# Patient Record
Sex: Female | Born: 1980 | Race: Black or African American | Hispanic: No | Marital: Married | Smoking: Never smoker
Health system: Southern US, Community
[De-identification: ages and names within clinical notes are randomized; demographics above are authoritative.]

## PROBLEM LIST (undated history)

## (undated) DIAGNOSIS — I10 Essential (primary) hypertension: Secondary | ICD-10-CM

## (undated) DIAGNOSIS — K760 Fatty (change of) liver, not elsewhere classified: Secondary | ICD-10-CM

## (undated) DIAGNOSIS — Z789 Other specified health status: Secondary | ICD-10-CM

## (undated) DIAGNOSIS — Z87442 Personal history of urinary calculi: Secondary | ICD-10-CM

## (undated) HISTORY — PX: NO PAST SURGERIES: SHX2092

---

## 2009-04-02 ENCOUNTER — Other Ambulatory Visit: Admission: RE | Admit: 2009-04-02 | Discharge: 2009-04-02 | Payer: Self-pay | Admitting: Family Medicine

## 2009-04-27 ENCOUNTER — Emergency Department (HOSPITAL_COMMUNITY): Admission: EM | Admit: 2009-04-27 | Discharge: 2009-04-27 | Payer: Self-pay | Admitting: Emergency Medicine

## 2011-01-17 LAB — POCT URINALYSIS DIP (DEVICE)
Bilirubin Urine: NEGATIVE
Ketones, ur: NEGATIVE mg/dL

## 2011-01-17 LAB — POCT PREGNANCY, URINE
Preg Test, Ur: NEGATIVE
Preg Test, Ur: NEGATIVE

## 2011-03-25 LAB — RUBELLA ANTIBODY, IGM: Rubella: IMMUNE

## 2011-03-25 LAB — ABO/RH: RH Type: POSITIVE

## 2011-03-25 LAB — ANTIBODY SCREEN: Antibody Screen: NEGATIVE

## 2011-03-25 LAB — HEPATITIS B SURFACE ANTIGEN: Hepatitis B Surface Ag: NEGATIVE

## 2011-03-25 LAB — GC/CHLAMYDIA PROBE AMP, GENITAL: Chlamydia: NEGATIVE

## 2011-03-25 LAB — RPR: RPR: NONREACTIVE

## 2011-04-14 ENCOUNTER — Inpatient Hospital Stay (HOSPITAL_COMMUNITY)
Admission: AD | Admit: 2011-04-14 | Discharge: 2011-04-14 | Disposition: A | Discharge status: Home / Self Care | Payer: 59 | Source: Ambulatory Visit | Attending: Obstetrics and Gynecology | Admitting: Obstetrics and Gynecology

## 2011-04-14 DIAGNOSIS — O209 Hemorrhage in early pregnancy, unspecified: Secondary | ICD-10-CM

## 2011-04-14 LAB — URINALYSIS, ROUTINE W REFLEX MICROSCOPIC
Protein, ur: NEGATIVE mg/dL
Urobilinogen, UA: 0.2 mg/dL (ref 0.0–1.0)

## 2011-04-14 LAB — URINE MICROSCOPIC-ADD ON

## 2011-10-27 ENCOUNTER — Encounter (HOSPITAL_COMMUNITY): Payer: Self-pay | Admitting: *Deleted

## 2011-10-27 ENCOUNTER — Encounter (HOSPITAL_COMMUNITY): Payer: Self-pay | Admitting: Anesthesiology

## 2011-10-27 ENCOUNTER — Inpatient Hospital Stay (HOSPITAL_COMMUNITY): Payer: 59 | Admitting: Anesthesiology

## 2011-10-27 ENCOUNTER — Inpatient Hospital Stay (HOSPITAL_COMMUNITY)
Admission: AD | Admit: 2011-10-27 | Discharge: 2011-10-29 | DRG: 775 | Disposition: A | Discharge status: Home / Self Care | Payer: 59 | Source: Ambulatory Visit | Attending: Obstetrics and Gynecology | Admitting: Obstetrics and Gynecology

## 2011-10-27 ENCOUNTER — Other Ambulatory Visit: Payer: Self-pay | Admitting: Obstetrics and Gynecology

## 2011-10-27 HISTORY — DX: Other specified health status: Z78.9

## 2011-10-27 LAB — COMPREHENSIVE METABOLIC PANEL
Alkaline Phosphatase: 143 U/L — ABNORMAL HIGH (ref 39–117)
BUN: 9 mg/dL (ref 6–23)
GFR calc Af Amer: >90 mL/min (ref >90)
GFR calc non Af Amer: >90 mL/min (ref >90)
Glucose, Bld: 79 mg/dL (ref 70–99)
Potassium: 4.1 mEq/L (ref 3.5–5.1)
Total Protein: 5.5 g/dL — ABNORMAL LOW (ref 6.0–8.3)

## 2011-10-27 LAB — URINALYSIS, ROUTINE W REFLEX MICROSCOPIC
Bilirubin Urine: NEGATIVE
Ketones, ur: NEGATIVE mg/dL
Protein, ur: 100 mg/dL — AB
Urobilinogen, UA: 0.2 mg/dL (ref 0.0–1.0)

## 2011-10-27 LAB — URINE MICROSCOPIC-ADD ON

## 2011-10-27 LAB — CBC
HCT: 34.9 % — ABNORMAL LOW (ref 36.0–46.0)
Hemoglobin: 11.4 g/dL — ABNORMAL LOW (ref 12.0–15.0)
MCH: 27.6 pg (ref 26.0–34.0)
MCHC: 32.7 g/dL (ref 30.0–36.0)

## 2011-10-27 LAB — URIC ACID: Uric Acid, Serum: 5.1 mg/dL (ref 2.4–7.0)

## 2011-10-27 MED ORDER — OXYTOCIN 20 UNITS IN LACTATED RINGERS INFUSION - SIMPLE
125.0000 mL/h | Freq: Once | INTRAVENOUS | Status: AC
  Administered 2011-10-27: 125 mL/h via INTRAVENOUS

## 2011-10-27 MED ORDER — LIDOCAINE HCL (PF) 1 % IJ SOLN
30.0000 mL | INTRAMUSCULAR | Status: DC | PRN
  Administered 2011-10-27: 30 mL via SUBCUTANEOUS
  Filled 2011-10-27: qty 30

## 2011-10-27 MED ORDER — ONDANSETRON HCL 4 MG/2ML IJ SOLN: 4.0000 mg | INTRAMUSCULAR | Status: DC | PRN

## 2011-10-27 MED ORDER — ONDANSETRON HCL 4 MG/2ML IJ SOLN: 4.0000 mg | Freq: Four times a day (QID) | INTRAMUSCULAR | Status: DC | PRN

## 2011-10-27 MED ORDER — EPHEDRINE 5 MG/ML INJ
10.0000 mg | INTRAVENOUS | Status: DC | PRN
  Filled 2011-10-27: qty 4

## 2011-10-27 MED ORDER — BENZOCAINE-MENTHOL 20-0.5 % EX AERO
INHALATION_SPRAY | CUTANEOUS | Status: AC
  Filled 2011-10-27: qty 56

## 2011-10-27 MED ORDER — EPHEDRINE 5 MG/ML INJ: 10.0000 mg | INTRAVENOUS | Status: DC | PRN

## 2011-10-27 MED ORDER — OXYTOCIN BOLUS FROM INFUSION
500.0000 mL | Freq: Once | INTRAVENOUS | Status: DC
  Filled 2011-10-27: qty 1000
  Filled 2011-10-27: qty 500

## 2011-10-27 MED ORDER — SENNOSIDES-DOCUSATE SODIUM 8.6-50 MG PO TABS
2.0000 | ORAL_TABLET | Freq: Every day | ORAL | Status: DC
  Administered 2011-10-28: 2 via ORAL

## 2011-10-27 MED ORDER — ONDANSETRON HCL 4 MG PO TABS: 4.0000 mg | ORAL_TABLET | ORAL | Status: DC | PRN

## 2011-10-27 MED ORDER — BENZOCAINE-MENTHOL 20-0.5 % EX AERO
1.0000 "application " | INHALATION_SPRAY | CUTANEOUS | Status: DC | PRN
  Administered 2011-10-28: 1 via TOPICAL

## 2011-10-27 MED ORDER — CITRIC ACID-SODIUM CITRATE 334-500 MG/5ML PO SOLN: 30.0000 mL | ORAL | Status: DC | PRN

## 2011-10-27 MED ORDER — LANOLIN HYDROUS EX OINT: TOPICAL_OINTMENT | CUTANEOUS | Status: DC | PRN

## 2011-10-27 MED ORDER — SODIUM BICARBONATE 8.4 % IV SOLN
INTRAVENOUS | Status: DC | PRN
  Administered 2011-10-27: 4 mL via EPIDURAL

## 2011-10-27 MED ORDER — PHENYLEPHRINE 40 MCG/ML (10ML) SYRINGE FOR IV PUSH (FOR BLOOD PRESSURE SUPPORT): 80.0000 ug | PREFILLED_SYRINGE | INTRAVENOUS | Status: DC | PRN

## 2011-10-27 MED ORDER — DIPHENHYDRAMINE HCL 50 MG/ML IJ SOLN: 12.5000 mg | INTRAMUSCULAR | Status: DC | PRN

## 2011-10-27 MED ORDER — LACTATED RINGERS IV SOLN
INTRAVENOUS | Status: DC
  Administered 2011-10-27 (×4): via INTRAVENOUS

## 2011-10-27 MED ORDER — OXYTOCIN 20 UNITS IN LACTATED RINGERS INFUSION - SIMPLE
1.0000 m[IU]/min | INTRAVENOUS | Status: DC
  Administered 2011-10-27: 8 m[IU]/min via INTRAVENOUS
  Administered 2011-10-27: 6 m[IU]/min via INTRAVENOUS
  Administered 2011-10-27: 2 m[IU]/min via INTRAVENOUS
  Administered 2011-10-27: 4 m[IU]/min via INTRAVENOUS

## 2011-10-27 MED ORDER — ZOLPIDEM TARTRATE 5 MG PO TABS: 5.0000 mg | ORAL_TABLET | Freq: Every evening | ORAL | Status: DC | PRN

## 2011-10-27 MED ORDER — FLEET ENEMA 7-19 GM/118ML RE ENEM: 1.0000 | ENEMA | RECTAL | Status: DC | PRN

## 2011-10-27 MED ORDER — OXYCODONE-ACETAMINOPHEN 5-325 MG PO TABS: 2.0000 | ORAL_TABLET | ORAL | Status: DC | PRN

## 2011-10-27 MED ORDER — DIBUCAINE 1 % RE OINT: 1.0000 "application " | TOPICAL_OINTMENT | RECTAL | Status: DC | PRN

## 2011-10-27 MED ORDER — OXYCODONE-ACETAMINOPHEN 5-325 MG PO TABS: 1.0000 | ORAL_TABLET | ORAL | Status: DC | PRN

## 2011-10-27 MED ORDER — TERBUTALINE SULFATE 1 MG/ML IJ SOLN: 0.2500 mg | Freq: Once | INTRAMUSCULAR | Status: DC | PRN

## 2011-10-27 MED ORDER — TETANUS-DIPHTH-ACELL PERTUSSIS 5-2.5-18.5 LF-MCG/0.5 IM SUSP: 0.5000 mL | Freq: Once | INTRAMUSCULAR | Status: DC

## 2011-10-27 MED ORDER — PHENYLEPHRINE 40 MCG/ML (10ML) SYRINGE FOR IV PUSH (FOR BLOOD PRESSURE SUPPORT)
80.0000 ug | PREFILLED_SYRINGE | INTRAVENOUS | Status: DC | PRN
  Filled 2011-10-27: qty 5

## 2011-10-27 MED ORDER — WITCH HAZEL-GLYCERIN EX PADS: 1.0000 "application " | MEDICATED_PAD | CUTANEOUS | Status: DC | PRN

## 2011-10-27 MED ORDER — FLEET ENEMA 7-19 GM/118ML RE ENEM: 1.0000 | ENEMA | Freq: Every day | RECTAL | Status: DC | PRN

## 2011-10-27 MED ORDER — LACTATED RINGERS IV SOLN: 500.0000 mL | INTRAVENOUS | Status: DC | PRN

## 2011-10-27 MED ORDER — BUTORPHANOL TARTRATE 2 MG/ML IJ SOLN: 1.0000 mg | INTRAMUSCULAR | Status: DC | PRN

## 2011-10-27 MED ORDER — DIPHENHYDRAMINE HCL 25 MG PO CAPS: 25.0000 mg | ORAL_CAPSULE | Freq: Four times a day (QID) | ORAL | Status: DC | PRN

## 2011-10-27 MED ORDER — IBUPROFEN 600 MG PO TABS: 600.0000 mg | ORAL_TABLET | Freq: Four times a day (QID) | ORAL | Status: DC | PRN

## 2011-10-27 MED ORDER — FENTANYL 2.5 MCG/ML BUPIVACAINE 1/10 % EPIDURAL INFUSION (WH - ANES)
INTRAMUSCULAR | Status: DC | PRN
Start: 2011-10-27 — End: 2011-10-28
  Administered 2011-10-27: 14 mL/h via EPIDURAL

## 2011-10-27 MED ORDER — IBUPROFEN 600 MG PO TABS
600.0000 mg | ORAL_TABLET | Freq: Four times a day (QID) | ORAL | Status: DC
  Administered 2011-10-28 – 2011-10-29 (×6): 600 mg via ORAL
  Filled 2011-10-27 (×5): qty 1

## 2011-10-27 MED ORDER — BISACODYL 10 MG RE SUPP: 10.0000 mg | Freq: Every day | RECTAL | Status: DC | PRN

## 2011-10-27 MED ORDER — LACTATED RINGERS IV SOLN: INTRAVENOUS | Status: DC

## 2011-10-27 MED ORDER — FENTANYL 2.5 MCG/ML BUPIVACAINE 1/10 % EPIDURAL INFUSION (WH - ANES)
14.0000 mL/h | INTRAMUSCULAR | Status: DC
  Administered 2011-10-27 (×3): 14 mL/h via EPIDURAL
  Filled 2011-10-27 (×4): qty 60

## 2011-10-27 MED ORDER — PRENATAL MULTIVITAMIN CH
1.0000 | ORAL_TABLET | Freq: Every day | ORAL | Status: DC
  Administered 2011-10-28: 11:00:00 via ORAL
  Administered 2011-10-29: 1 via ORAL
  Filled 2011-10-27 (×2): qty 1

## 2011-10-27 MED ORDER — LACTATED RINGERS IV SOLN
500.0000 mL | Freq: Once | INTRAVENOUS | Status: AC
  Administered 2011-10-27: 500 mL via INTRAVENOUS

## 2011-10-27 MED ORDER — ACETAMINOPHEN 325 MG PO TABS: 650.0000 mg | ORAL_TABLET | ORAL | Status: DC | PRN

## 2011-10-27 MED ORDER — SIMETHICONE 80 MG PO CHEW: 80.0000 mg | CHEWABLE_TABLET | ORAL | Status: DC | PRN

## 2011-10-27 NOTE — Anesthesia Preprocedure Evaluation (Signed)

## 2011-10-27 NOTE — Progress Notes (Signed)
Delivery Note Rapid progress , was 8cm, +1, ROP with variable decels  Rolled to LLD position , rapid change to C&P at +3 SVD, ROA, VMI  Apgars  7/9, art pH 7.25 Wt 5# 2 oz Placenta intact, 3 vessels to path for SGA Cx/vag intact Second degree ML lac  Repaired Pt infant stable in LDR

## 2011-10-27 NOTE — H&P (Signed)
Kristin Buck is a 31 y.o. female presenting for C/O UCs since about 8 pm last night.  Now getting stronger.  No ROM/HA/vision change/epigastric pain. Maternal Medical History:  Reason for admission: Reason for admission: contractions.  Contractions: Onset was 6-12 hours ago.   Frequency: regular.   Perceived severity is moderate.    Fetal activity: Perceived fetal activity is normal.      OB History    Grav Para Term Preterm Abortions TAB SAB Ect Mult Living   1              Past Medical History  Diagnosis Date  . No pertinent past medical history    Past Surgical History  Procedure Date  . No past surgeries    Family History: family history is negative for Anesthesia problems, and Malignant hyperthermia, and Hypotension, and Pseudochol deficiency, . Social History:  reports that she has never smoked. She has never used smokeless tobacco. She reports that she does not drink alcohol or use illicit drugs.  Review of Systems  Eyes: Negative for blurred vision.  Gastrointestinal: Negative for abdominal pain.  Neurological: Negative for headaches.    Dilation: 2.5 Effacement (%): 80 Station: -2 Exam by:: Kristin Buck Blood pressure 138/85, pulse 78, temperature 98.6 F (37 C), temperature source Oral, resp. rate 18, height 5' 8.5" (1.74 m), weight 95.709 kg (211 lb), SpO2 98.00%. Maternal Exam:  Uterine Assessment: Contraction strength is firm.  Contraction frequency is regular.   Abdomen: Patient reports no abdominal tenderness.   Fetal Exam Fetal Monitor Review: Pattern: accelerations present.       Physical Exam  Cardiovascular: Normal rate and regular rhythm.   Respiratory: Effort normal and breath sounds normal.  Neurological: She has normal reflexes.   AROM  Clear Results for orders placed during the hospital encounter of 10/27/11 (from the past 24 hour(s))  STREP B DNA PROBE     Status: Normal      Component Value Range   Group B Strep Ag Negative      CBC     Status: Abnormal   Collection Time   10/27/11  5:00 AM      Component Value Range   WBC 10.2  4.0 - 10.5 (K/uL)   RBC 4.13  3.87 - 5.11 (MIL/uL)   Hemoglobin 11.4 (*) 12.0 - 15.0 (g/dL)   HCT 16.1 (*) 09.6 - 46.0 (%)   MCV 84.5  78.0 - 100.0 (fL)   MCH 27.6  26.0 - 34.0 (pg)   MCHC 32.7  30.0 - 36.0 (g/dL)   RDW 04.5 (*) 40.9 - 15.5 (%)   Platelets 185  150 - 400 (K/uL)  COMPREHENSIVE METABOLIC PANEL     Status: Abnormal   Collection Time   10/27/11  5:00 AM      Component Value Range   Sodium 136  135 - 145 (mEq/L)   Potassium 4.1  3.5 - 5.1 (mEq/L)   Chloride 104  96 - 112 (mEq/L)   CO2 24  19 - 32 (mEq/L)   Glucose, Bld 79  70 - 99 (mg/dL)   BUN 9  6 - 23 (mg/dL)   Creatinine, Ser 8.11  0.50 - 1.10 (mg/dL)   Calcium 9.3  8.4 - 91.4 (mg/dL)   Total Protein 5.5 (*) 6.0 - 8.3 (g/dL)   Albumin 2.7 (*) 3.5 - 5.2 (g/dL)   AST 18  0 - 37 (U/L)   ALT 17  0 - 35 (U/L)   Alkaline Phosphatase  143 (*) 39 - 117 (U/L)   Total Bilirubin 0.1 (*) 0.3 - 1.2 (mg/dL)   GFR calc non Af Amer >90  >90 (mL/min)   GFR calc Af Amer >90  >90 (mL/min)  URIC ACID     Status: Normal   Collection Time   10/27/11  5:00 AM      Component Value Range   Uric Acid, Serum 5.1  2.4 - 7.0 (mg/dL)  LACTATE DEHYDROGENASE     Status: Normal   Collection Time   10/27/11  5:00 AM      Component Value Range   LD 194  94 - 250 (U/L)  URINALYSIS, ROUTINE W REFLEX MICROSCOPIC     Status: Abnormal   Collection Time   10/27/11  5:40 AM      Component Value Range   Color, Urine YELLOW  YELLOW    APPearance CLEAR  CLEAR    Specific Gravity, Urine 1.025  1.005 - 1.030    pH 6.0  5.0 - 8.0    Glucose, UA NEGATIVE  NEGATIVE (mg/dL)   Hgb urine dipstick LARGE (*) NEGATIVE    Bilirubin Urine NEGATIVE  NEGATIVE    Ketones, ur NEGATIVE  NEGATIVE (mg/dL)   Protein, ur 161 (*) NEGATIVE (mg/dL)   Urobilinogen, UA 0.2  0.0 - 1.0 (mg/dL)   Nitrite NEGATIVE  NEGATIVE    Leukocytes, UA NEGATIVE  NEGATIVE   URINE  MICROSCOPIC-ADD ON     Status: Normal   Collection Time   10/27/11  5:40 AM      Component Value Range   Squamous Epithelial / LPF RARE  RARE    WBC, UA 3-6  <3 (WBC/hpf)   RBC / HPF 0-2  <3 (RBC/hpf)   Bacteria, UA RARE  RARE    Prenatal labs: ABO, Rh: B/Positive/-- (06/14 0000) Antibody: Negative (06/14 0000) Rubella: Immune (06/14 0000) RPR: Nonreactive (06/14 0000)  HBsAg: Negative (06/14 0000)  HIV: Non-reactive (06/14 0000)  GBS: Negative (01/16 0646)   Assessment/Plan: 31 yo MBF G1P0 at 33 1/7 weeks with labor. D/W pt above-will watch BPs. D/W pain relief options.   Kristin Buck,Kristin Buck 10/27/2011, 8:45 AM

## 2011-10-27 NOTE — Progress Notes (Signed)
Epidural in FHT reactive Cx 4 cm per nurse check about 1 hour ago

## 2011-10-27 NOTE — ED Provider Notes (Signed)
History     Chief Complaint  Patient presents with  . Contractions   HPI 31 y.o. G1P0 at [redacted]w[redacted]d here for labor check. Medical screening exam requested by RN d/t elevated BPs, labs ordered for PIH eval by Dr. Arelia Sneddon. Pt states she has had some elevated BPs during prenatal visits. Denies headache, vision changes, abdominal pain. + fetal movement.    Past Medical History  Diagnosis Date  . No pertinent past medical history     Past Surgical History  Procedure Date  . No past surgeries     Family History  Problem Relation Age of Onset  . Anesthesia problems Neg Hx   . Malignant hyperthermia Neg Hx   . Hypotension Neg Hx   . Pseudochol deficiency Neg Hx     History  Substance Use Topics  . Smoking status: Never Smoker   . Smokeless tobacco: Never Used  . Alcohol Use: No    Allergies: No Known Allergies  Prescriptions prior to admission  Medication Sig Dispense Refill  . Prenatal Vit-Fe Fumarate-FA (PRENATAL MULTIVITAMIN) TABS Take 1 tablet by mouth daily.        Review of Systems  Constitutional: Negative.   Respiratory: Negative.   Cardiovascular: Negative.   Gastrointestinal: Negative for nausea, vomiting, abdominal pain, diarrhea and constipation.  Genitourinary: Negative for dysuria, urgency, frequency, hematuria and flank pain.       Negative for vaginal bleeding, Positive for contractions  Musculoskeletal: Negative.   Neurological: Negative.   Psychiatric/Behavioral: Negative.    Physical Exam   Blood pressure 149/91, pulse 78, temperature 99.1 F (37.3 C), temperature source Oral, resp. rate 20, height 5' 8.5" (1.74 m), weight 211 lb (95.709 kg), SpO2 98.00%.  Physical Exam  Nursing note and vitals reviewed. Constitutional: She is oriented to person, place, and time. She appears well-developed and well-nourished.  Cardiovascular: Normal rate.   Respiratory: Effort normal.  GI: Soft. There is no tenderness.  Musculoskeletal: Normal range of motion.    Neurological: She is alert and oriented to person, place, and time. She has normal reflexes. No cranial nerve deficit.  Skin: Skin is warm and dry.  Psychiatric: She has a normal mood and affect.   EFM reactive  MAU Course  Procedures  Results for orders placed during the hospital encounter of 10/27/11 (from the past 24 hour(s))  CBC     Status: Abnormal   Collection Time   10/27/11  5:00 AM      Component Value Range   WBC 10.2  4.0 - 10.5 (K/uL)   RBC 4.13  3.87 - 5.11 (MIL/uL)   Hemoglobin 11.4 (*) 12.0 - 15.0 (g/dL)   HCT 69.6 (*) 29.5 - 46.0 (%)   MCV 84.5  78.0 - 100.0 (fL)   MCH 27.6  26.0 - 34.0 (pg)   MCHC 32.7  30.0 - 36.0 (g/dL)   RDW 28.4 (*) 13.2 - 15.5 (%)   Platelets 185  150 - 400 (K/uL)  COMPREHENSIVE METABOLIC PANEL     Status: Abnormal   Collection Time   10/27/11  5:00 AM      Component Value Range   Sodium 136  135 - 145 (mEq/L)   Potassium 4.1  3.5 - 5.1 (mEq/L)   Chloride 104  96 - 112 (mEq/L)   CO2 24  19 - 32 (mEq/L)   Glucose, Bld 79  70 - 99 (mg/dL)   BUN 9  6 - 23 (mg/dL)   Creatinine, Ser 4.40  0.50 - 1.10 (  mg/dL)   Calcium 9.3  8.4 - 16.1 (mg/dL)   Total Protein 5.5 (*) 6.0 - 8.3 (g/dL)   Albumin 2.7 (*) 3.5 - 5.2 (g/dL)   AST 18  0 - 37 (U/L)   ALT 17  0 - 35 (U/L)   Alkaline Phosphatase 143 (*) 39 - 117 (U/L)   Total Bilirubin 0.1 (*) 0.3 - 1.2 (mg/dL)   GFR calc non Af Amer >90  >90 (mL/min)   GFR calc Af Amer >90  >90 (mL/min)  URIC ACID     Status: Normal   Collection Time   10/27/11  5:00 AM      Component Value Range   Uric Acid, Serum 5.1  2.4 - 7.0 (mg/dL)  LACTATE DEHYDROGENASE     Status: Normal   Collection Time   10/27/11  5:00 AM      Component Value Range   LD 194  94 - 250 (U/L)  URINALYSIS, ROUTINE W REFLEX MICROSCOPIC     Status: Abnormal   Collection Time   10/27/11  5:40 AM      Component Value Range   Color, Urine YELLOW  YELLOW    APPearance CLEAR  CLEAR    Specific Gravity, Urine 1.025  1.005 - 1.030    pH  6.0  5.0 - 8.0    Glucose, UA NEGATIVE  NEGATIVE (mg/dL)   Hgb urine dipstick LARGE (*) NEGATIVE    Bilirubin Urine NEGATIVE  NEGATIVE    Ketones, ur NEGATIVE  NEGATIVE (mg/dL)   Protein, ur 096 (*) NEGATIVE (mg/dL)   Urobilinogen, UA 0.2  0.0 - 1.0 (mg/dL)   Nitrite NEGATIVE  NEGATIVE    Leukocytes, UA NEGATIVE  NEGATIVE   URINE MICROSCOPIC-ADD ON     Status: Normal   Collection Time   10/27/11  5:40 AM      Component Value Range   Squamous Epithelial / LPF RARE  RARE    WBC, UA 3-6  <3 (WBC/hpf)   RBC / HPF 0-2  <3 (RBC/hpf)   Bacteria, UA RARE  RARE      Assessment and Plan  31 y.o. G1P0 at [redacted]w[redacted]d [facility] - labs and status to be reported to Dr. Arelia Sneddon by RN  Georges Mouse 10/27/2011, 6:14 AM

## 2011-10-27 NOTE — Anesthesia Procedure Notes (Signed)

## 2011-10-28 ENCOUNTER — Encounter (HOSPITAL_COMMUNITY): Payer: Self-pay | Admitting: Pediatrics

## 2011-10-28 LAB — CBC
MCH: 28.2 pg (ref 26.0–34.0)
MCHC: 33.6 g/dL (ref 30.0–36.0)
Platelets: 181 10*3/uL (ref 150–400)

## 2011-10-28 NOTE — Anesthesia Postprocedure Evaluation (Signed)
  Anesthesia Post-op Note  Patient: Kristin Buck  Procedure(s) Performed: * No procedures listed *  Patient Location: PACU and Mother/Baby  Anesthesia Type: Epidural  Level of Consciousness: awake, alert  and oriented  Airway and Oxygen Therapy: Patient Spontanous Breathing  Post-op Assessment: Post-op Vital signs reviewed, Patient's Cardiovascular Status Stable, No headache, No backache, No residual numbness and No residual motor weakness  Post-op Vital Signs: Reviewed and stable  Complications: No apparent anesthesia complications

## 2011-10-28 NOTE — Progress Notes (Signed)
Post Partum Day 1 Subjective: no complaints, up ad lib, voiding and tolerating PO  Objective: Blood pressure 136/85, pulse 97, temperature 98 F (36.7 C), temperature source Oral, resp. rate 18, height 5' 8.5" (1.74 m), weight 95.709 kg (211 lb), SpO2 98.00%, unknown if currently breastfeeding.  Physical Exam:  General: alert and cooperative Lochia: appropriate Uterine Fundus: firm Perineum intact DVT Evaluation: No evidence of DVT seen on physical exam.   Basename 10/28/11 0607 10/27/11 0500  HGB 9.6* 11.4*  HCT 28.6* 34.9*    Assessment/Plan: Plan for discharge tomorrow   LOS: 1 day   Kristin Buck,Kristin Buck 10/28/2011, 8:09 AM

## 2011-10-29 LAB — COMPREHENSIVE METABOLIC PANEL
AST: 27 U/L (ref 0–37)
Albumin: 2.6 g/dL — ABNORMAL LOW (ref 3.5–5.2)
Chloride: 106 mEq/L (ref 96–112)
Creatinine, Ser: 0.68 mg/dL (ref 0.50–1.10)
Potassium: 3.8 mEq/L (ref 3.5–5.1)
Total Bilirubin: 0.1 mg/dL — ABNORMAL LOW (ref 0.3–1.2)
Total Protein: 5.5 g/dL — ABNORMAL LOW (ref 6.0–8.3)

## 2011-10-29 LAB — CBC
MCHC: 33.2 g/dL (ref 30.0–36.0)
MCV: 84.6 fL (ref 78.0–100.0)
Platelets: 182 10*3/uL (ref 150–400)
RDW: 16 % — ABNORMAL HIGH (ref 11.5–15.5)
WBC: 13 10*3/uL — ABNORMAL HIGH (ref 4.0–10.5)

## 2011-10-29 MED ORDER — IBUPROFEN 600 MG PO TABS: 600.0000 mg | ORAL_TABLET | Freq: Four times a day (QID) | ORAL | Status: AC

## 2011-10-29 MED ORDER — OXYCODONE-ACETAMINOPHEN 5-325 MG PO TABS: 1.0000 | ORAL_TABLET | ORAL | Status: DC | PRN

## 2011-10-29 NOTE — Discharge Summary (Signed)
Obstetric Discharge Summary Reason for Admission: onset of labor Prenatal Procedures: ultrasound Intrapartum Procedures: spontaneous vaginal delivery Postpartum Procedures: none Complications-Operative and Postpartum: 2 degree perineal laceration Hemoglobin  Date Value Range Status  10/29/2011 9.3* 12.0-15.0 (g/dL) Final     HCT  Date Value Range Status  10/29/2011 28.0* 36.0-46.0 (%) Final    Discharge Diagnoses: Term Pregnancy-delivered  Discharge Information: Date: 10/29/2011 Activity: pelvic rest Diet: routine Medications: PNV, Ibuprofen and Percocet Condition: stable Instructions: refer to practice specific booklet Discharge to: home   Newborn Data: Live born female  Birth Weight: 5 lb 3.6 oz (2370 g) APGAR: 7, 9  Home with mother.  Rendy Lazard G 10/29/2011, 9:17 AM

## 2011-10-29 NOTE — Progress Notes (Signed)
Post Partum Day 2 Subjective: up ad lib, voiding, tolerating PO, + flatus and denies HA, RUQ pain. Baby has not voided after circ  Objective: Blood pressure 144/90, pulse 103, temperature 97.9 F (36.6 C), temperature source Oral, resp. rate 20, height 5' 8.5" (1.74 m), weight 95.709 kg (211 lb), SpO2 98.00%, unknown if currently breastfeeding.  Physical Exam:  General: alert and cooperative Lochia: appropriate Uterine Fundus: firm Perineum intact DVT Evaluation: No evidence of DVT seen on physical exam. DTR's 2-3 + , small pedal edema  Basename 10/28/11 0607 10/27/11 0500  HGB 9.6* 11.4*  HCT 28.6* 34.9*    Assessment/Plan: PIH labs ordered Labs normal  Plan to discharge home with PIH precautions and BP recheck scheduled for 3 days   LOS: 2 days   CURTIS,CAROL G 10/29/2011, 7:54 AM

## 2011-10-29 NOTE — Progress Notes (Signed)
Pt requested supplemental feeding for infant because she felt baby was not getting enough from breastfeeding.  Pt given Enfamil and I monitored father feed infant 10 ml of formula.

## 2011-11-01 ENCOUNTER — Ambulatory Visit (HOSPITAL_COMMUNITY)
Admit: 2011-11-01 | Discharge: 2011-11-01 | Disposition: A | Discharge status: Home / Self Care | Payer: 59 | Attending: Obstetrics and Gynecology | Admitting: Obstetrics and Gynecology

## 2011-11-01 ENCOUNTER — Inpatient Hospital Stay (HOSPITAL_COMMUNITY)
Admission: AD | Admit: 2011-11-01 | Discharge: 2011-11-03 | DRG: 776 | Disposition: A | Discharge status: Home / Self Care | Payer: 59 | Source: Ambulatory Visit | Attending: Obstetrics and Gynecology | Admitting: Obstetrics and Gynecology

## 2011-11-01 ENCOUNTER — Other Ambulatory Visit: Payer: Self-pay | Admitting: Obstetrics and Gynecology

## 2011-11-01 DIAGNOSIS — IMO0002 Reserved for concepts with insufficient information to code with codable children: Principal | ICD-10-CM | POA: Diagnosis present

## 2011-11-01 DIAGNOSIS — O1490 Unspecified pre-eclampsia, unspecified trimester: Secondary | ICD-10-CM

## 2011-11-01 MED ORDER — HYDROCHLOROTHIAZIDE 25 MG PO TABS
50.0000 mg | ORAL_TABLET | Freq: Every day | ORAL | Status: AC
  Administered 2011-11-01 – 2011-11-02 (×2): 50 mg via ORAL
  Filled 2011-11-01 (×2): qty 2

## 2011-11-01 MED ORDER — SIMETHICONE 80 MG PO CHEW: 80.0000 mg | CHEWABLE_TABLET | ORAL | Status: DC | PRN

## 2011-11-01 MED ORDER — DEXTROSE IN LACTATED RINGERS 5 % IV SOLN
INTRAVENOUS | Status: DC
  Administered 2011-11-01 – 2011-11-02 (×3): via INTRAVENOUS

## 2011-11-01 MED ORDER — SENNOSIDES-DOCUSATE SODIUM 8.6-50 MG PO TABS: 2.0000 | ORAL_TABLET | Freq: Every day | ORAL | Status: DC

## 2011-11-01 MED ORDER — FLEET ENEMA 7-19 GM/118ML RE ENEM: 1.0000 | ENEMA | Freq: Every day | RECTAL | Status: DC | PRN

## 2011-11-01 MED ORDER — LANOLIN HYDROUS EX OINT: TOPICAL_OINTMENT | CUTANEOUS | Status: DC | PRN

## 2011-11-01 MED ORDER — OXYCODONE-ACETAMINOPHEN 5-325 MG PO TABS: 1.0000 | ORAL_TABLET | Freq: Four times a day (QID) | ORAL | Status: DC | PRN

## 2011-11-01 MED ORDER — BISACODYL 10 MG RE SUPP: 10.0000 mg | Freq: Every day | RECTAL | Status: DC | PRN

## 2011-11-01 MED ORDER — WITCH HAZEL-GLYCERIN EX PADS: 1.0000 "application " | MEDICATED_PAD | CUTANEOUS | Status: DC | PRN

## 2011-11-01 MED ORDER — ZOLPIDEM TARTRATE 5 MG PO TABS: 5.0000 mg | ORAL_TABLET | Freq: Every evening | ORAL | Status: DC | PRN

## 2011-11-01 MED ORDER — ONDANSETRON HCL 4 MG PO TABS: 4.0000 mg | ORAL_TABLET | ORAL | Status: DC | PRN

## 2011-11-01 MED ORDER — LIDOCAINE HCL (PF) 1 % IJ SOLN
30.0000 mL | INTRAMUSCULAR | Status: DC | PRN
  Filled 2011-11-01: qty 30

## 2011-11-01 MED ORDER — DIPHENHYDRAMINE HCL 25 MG PO CAPS: 25.0000 mg | ORAL_CAPSULE | Freq: Four times a day (QID) | ORAL | Status: DC | PRN

## 2011-11-01 MED ORDER — MAGNESIUM SULFATE BOLUS VIA INFUSION
4.0000 g | Freq: Once | INTRAVENOUS | Status: AC
  Administered 2011-11-01: 4 g via INTRAVENOUS
  Filled 2011-11-01: qty 500

## 2011-11-01 MED ORDER — BENZOCAINE-MENTHOL 20-0.5 % EX AERO
1.0000 "application " | INHALATION_SPRAY | CUTANEOUS | Status: DC | PRN
  Administered 2011-11-02: 1 via TOPICAL

## 2011-11-01 MED ORDER — ONDANSETRON HCL 4 MG/2ML IJ SOLN: 4.0000 mg | INTRAMUSCULAR | Status: DC | PRN

## 2011-11-01 MED ORDER — IBUPROFEN 800 MG PO TABS
800.0000 mg | ORAL_TABLET | Freq: Three times a day (TID) | ORAL | Status: DC | PRN
  Administered 2011-11-02 – 2011-11-03 (×4): 800 mg via ORAL
  Filled 2011-11-01 (×4): qty 1

## 2011-11-01 MED ORDER — PRENATAL MULTIVITAMIN CH
1.0000 | ORAL_TABLET | Freq: Every day | ORAL | Status: DC
Start: 2011-11-02 — End: 2011-11-03
  Administered 2011-11-02 – 2011-11-03 (×2): 1 via ORAL
  Filled 2011-11-01 (×2): qty 1

## 2011-11-01 MED ORDER — MAGNESIUM SULFATE 40 G IN LACTATED RINGERS - SIMPLE
2.0000 g/h | INTRAVENOUS | Status: AC
  Administered 2011-11-01: 2 g/h via INTRAVENOUS
  Filled 2011-11-01: qty 500

## 2011-11-01 MED ORDER — DIBUCAINE 1 % RE OINT: 1.0000 "application " | TOPICAL_OINTMENT | RECTAL | Status: DC | PRN

## 2011-11-01 NOTE — Progress Notes (Signed)
New admit - pt rec'd as direct adm to room 372 under services Dr. Marcelle Overlie.  Hx SVD 1/16, term female infant.  Pt breast/bottle feeding.  Uncomplicated delivery.  Seen in office today with  inc BP, BLE edema, elevated LFT's and proteinuria.   Here for Magnesium Sulfate therapy.

## 2011-11-01 NOTE — H&P (Signed)
NAME:  Kristin Buck, Kristin Buck NO.:  [id number]  MEDICAL RECORD NO.:  [id number]  LOCATION:  L                             FACILITY:  [facility]  PHYSICIAN:  Duke Salvia. Marcelle Overlie, M.D.DATE OF BIRTH:  1980/11/22  DATE OF ADMISSION:  11/01/2011 DATE OF DISCHARGE:                             HISTORY & PHYSICAL   CHIEF COMPLAINT:  Postpartum was significant edema.  HISTORY OF PRESENT ILLNESS:  A 31 year old who is 5 days postpartum vaginal delivery.  I was told to come back in the office today for followup.  In our office, BP 170/98.  Urine 1+ for protein.  Repeat lab work today showed slight elevation of her SGOT and PT which were formally normal on October 29, 2011, when she was discharged from the hospital.  She is admitted now for diuresis in 24 hours of magnesium sulfate treatment.  PAST MEDICAL HISTORY:  Please see the [facility] form for details.  PHYSICAL EXAMINATION:  VITAL SIGNS:  BP 170/98. HEENT:  Unremarkable. NECK:  Supple without masses. LUNGS:  Clear. CARDIOVASCULAR:  Regular rate and rhythm without murmurs, rubs, gallops. BREASTS:  Not examined. ABDOMEN:  Soft, flat, fundus was firm and nontender. PELVIC:  Deferred. EXTREMITIES:  Lower extremities 2+.  Significant lower extremity edema. Reflexes 2+, no clonus.  IMPRESSION:  Postpartum preeclampsia with slight abnormality of her liver function tests and significant edema.  PLAN:  We will admit for magnesium sulfate x24 hours, plus diuresis.     Jadee Golebiewski M. Marcelle Overlie, M.D.     [facility]/MEDQ  D:  11/01/2011  T:  11/01/2011  Job:  [id number]

## 2011-11-02 LAB — CBC
MCH: 27.7 pg (ref 26.0–34.0)
MCV: 83.8 fL (ref 78.0–100.0)
Platelets: 279 10*3/uL (ref 150–400)
RBC: 3.76 MIL/uL — ABNORMAL LOW (ref 3.87–5.11)
RDW: 15.6 % — ABNORMAL HIGH (ref 11.5–15.5)

## 2011-11-02 LAB — URIC ACID: Uric Acid, Serum: 5.5 mg/dL (ref 2.4–7.0)

## 2011-11-02 LAB — MAGNESIUM: Magnesium: 3.9 mg/dL — ABNORMAL HIGH (ref 1.5–2.5)

## 2011-11-02 LAB — COMPREHENSIVE METABOLIC PANEL
AST: 36 U/L (ref 0–37)
CO2: 26 mEq/L (ref 19–32)
Calcium: 8.6 mg/dL (ref 8.4–10.5)
Creatinine, Ser: 0.61 mg/dL (ref 0.50–1.10)
GFR calc Af Amer: >90 mL/min (ref >90)
GFR calc non Af Amer: >90 mL/min (ref >90)

## 2011-11-02 MED ORDER — BENZOCAINE-MENTHOL 20-0.5 % EX AERO
INHALATION_SPRAY | CUTANEOUS | Status: AC
  Filled 2011-11-02: qty 56

## 2011-11-02 NOTE — Progress Notes (Signed)
Adult Lactation Consultation Outpatient Visit Note  Patient Name: Kristin Buck Date of Birth: 04/10/1981 Gestational Age at Delivery: [redacted]w[redacted]d Type of Delivery: Vag  Breastfeeding History: Frequency of Breastfeeding: every 2.5-3 hours with the SNS with formula added. Mother giving the baby 45-52ml and states baby tolerates well. She gives expressed breast milk by dropper prior to feeding. Length of Feeding: 15-20 minutes Voids: greater than 8/day Stools: 3-4/day; brown  Supplementing / Method: Pumping:  Type of Pump :PIS Advance   Frequency: Every 3 hours  Volume:  5 ml  Comments: Pt here today due to severe and unrelieved engorgement x 36 hours. Baby has been latching to breast with SNS but the last 2 nights, the  baby has been finger fed and bottle fed due to mother's fatigue and breast pain. She continues to pump during the night. Mother is very motivated to exclusively breast feed.   Consultation Evaluation:  Initial Feeding Assessment: Pre-feed Weight: Post-feed Weight: Amount Transferred:Infant was not placed at the breast due to severe engorgement, poor let down and minimum milk transfer. Infant is 5 pounds and was recently fed prior to appointment. Mother did give Chrissie Noa her expressed milk of 5 ml. Comments:  Additional Feeding Assessment: Pre-feed Weight: Post-feed Weight: Amount Transferred: Comments:  Additional Feeding Assessment: Pre-feed Weight: Post-feed Weight: Amount Transferred: Comments:  Total Breast milk Transferred this Visit:  Total Supplement Given:   Additional Interventions: Management of severe engorgement was the focus of this appointment. Breast were hard to touch in all quadrants. Slight breast softening after pumping for 20 minutes along with breast massage. Mother pumped with her PIS and flange size was changed to 27 mm to increase comfort. Attempted 30 mm but "pop offs" were noted. Reviewed protocol for managing engorgement using ice  and heat, reverse pressure softening, limited cabbage leaf usage and hand expression. Replaced mother's breast pump with a  hospital grade DEBP but it did not make a difference in milk  output. There was no evidence of mastitis other than soreness at this time. Mother had 4+ pitting edema at discharge and still has extreme edema in feet and lower extremities. She has been seen by her OB and labs drawn today to  evaluate for PIH. Her OB has suggested a diuretic to reduce fluid. Instructed to stay well hydrated, breast feed often and pump for additional stimulation to maintain milk supply if diuretic is started.Mother will continue to use the SNS at the breast with formula/EBM added until able to have milk let downs.  Follow-Up  Nov 04, 2011 at 4:00pm  Lactation appointment to evaluate engorgement, milk supply and infant's ability to transfer milk.    Christella Hartigan M 11/02/2011, 11:10 AM

## 2011-11-02 NOTE — Progress Notes (Signed)
Repositioned, BP retaken - has been lying with BP cuff on dependant arm - new value 148/95 107)

## 2011-11-02 NOTE — Progress Notes (Signed)
No HA/vision change/epigastric pain  PE  BP  130-147/87-97        UO good  I/O 1514/3000  Lungs CTA Cor NSR Ext 1+ edema DTR 1+  Magnesium sulfate running Labs pending  A/P  PP preeclampsia         Check labs today         Continue magnesium for 24 hrs total         D/W pt

## 2011-11-02 NOTE — Consult Note (Signed)
I briefly met with mom in AICU. Mom deilvered 5 days ago, baby SGA and not able to latch. Mom admitted for high BP. Her breasts are extremely engorged. She has her PIS DEP with her. I helped her pump - massage  - minimal amounts of milk expressed from each side - total 4-5 ml;s. I gave mom ice packs to use. Mom knows to use heat with pumping. I also showed her how to use her machine ot pump one breast at a time, so she can massage while pumping.Mom is getting her ibuprofen every 6 hours.

## 2011-11-02 NOTE — Progress Notes (Signed)
UR chart review completed.  

## 2011-11-03 LAB — COMPREHENSIVE METABOLIC PANEL
ALT: 39 U/L — ABNORMAL HIGH (ref 0–35)
AST: 28 U/L (ref 0–37)
Albumin: 2.7 g/dL — ABNORMAL LOW (ref 3.5–5.2)
Alkaline Phosphatase: 112 U/L (ref 39–117)
Chloride: 101 mEq/L (ref 96–112)
Potassium: 3.7 mEq/L (ref 3.5–5.1)
Sodium: 134 mEq/L — ABNORMAL LOW (ref 135–145)
Total Bilirubin: 0.2 mg/dL — ABNORMAL LOW (ref 0.3–1.2)

## 2011-11-03 LAB — CBC
HCT: 30.9 % — ABNORMAL LOW (ref 36.0–46.0)
Platelets: 281 10*3/uL (ref 150–400)
RDW: 15.4 % (ref 11.5–15.5)
WBC: 7.7 10*3/uL (ref 4.0–10.5)

## 2011-11-03 NOTE — Progress Notes (Signed)
Pt. Being d/c'd home, accompanied by her husband and mother and NT

## 2011-11-03 NOTE — Progress Notes (Signed)
Pt. With D/C home orders acknowledged, VS checked one last time when d/cing IVL and BP elevated. Dr. Rana Snare notified and v.o. received to have me call pharmacy with perscription for Labetalol 100mg  p.o. BID.  Prescription called in to [facility] on [address]. in [place]. Pharmacy picked by pt.

## 2011-11-03 NOTE — Progress Notes (Signed)
Patient ID: Kristin Buck, female   DOB: Feb 09, 1981, 31 y.o.   MRN: [id number] Pt without complaints VSSAF BP 100-130/70-90 Labs improved UPO -4100/24 hours DTRs 3/4 1+ edema  PP PIH Stable of MgSo4 DC homoe with FU in 2 days for BP and recheck labs DL

## 2011-11-04 ENCOUNTER — Encounter (HOSPITAL_COMMUNITY): Payer: 59

## 2011-11-08 ENCOUNTER — Encounter (HOSPITAL_COMMUNITY): Payer: 59

## 2011-11-09 NOTE — Discharge Summary (Signed)
Patient Name: Kristin Buck ZOX:[id number] 31 yo female G1P1001 that was admitted 5 days postpartum uncomplicated vaginal delivery for pp edema and elevated bp. BP in the office was 170/98 and patient was observed to have 1+ proteinuria. PIH labs had also revealed a slight elevation in AST and ALT. Patient was  admitted for MagSo4 and diuresis. Magso4 was administered for 24 hrs. BP remained stable 130/80's. Total urine output  was 4100 cc /24 hrs. Patient was started on Labetalol 100 mg BID. Precautions were reviewed with patient and patient was discharged home with instructions to RTO in 2 days for BP check and repeat PIH labs.  CC, [facility]NP

## 2011-11-10 ENCOUNTER — Ambulatory Visit (HOSPITAL_COMMUNITY)
Admission: RE | Admit: 2011-11-10 | Discharge: 2011-11-10 | Disposition: A | Discharge status: Home / Self Care | Payer: 59 | Source: Ambulatory Visit | Attending: Obstetrics and Gynecology | Admitting: Obstetrics and Gynecology

## 2011-11-10 NOTE — Progress Notes (Signed)
Adult Lactation Consultation Outpatient Visit Note  Patient Name: Kristin Buck Date of Birth: [birth year] Gestational Age at Delivery: [redacted]w[redacted]d Type of Delivery:  Per mom had difficulty with engorgement for 3-4 days and then was readmitted due to elevated B/P and was placed on Mag in AICU and continued to have problems with engorgement ,per mom cold cabbage leaves helped the most . While mom was in the hospital the 2nd time infant received bottles because mom did not feel up to breast feeding due to the engorgement and now has been having difficulty re latching .  Breastfeeding History: Frequency of Breastfeeding: see above note  Length of Feeding:  Voids: >6 Stools:> 2-3 yellow seedy stools    Supplementing / Method: Pumping:  Type of Pump:DEBP Medela    Frequency:approximately every 3-4 hours ( per probably 8x's in 24 hours)   Volume: up to 60 ml 2 breast combined    Comments:per mom denies any further engorgement except today presented at consult full to firm due to a GYN appointment .     Consultation Evaluation:  Initial Feeding Assessment: Pre-feed Weight:6-3.6 (2822g)  Post-feed Weight: 6.4.4oz (2848g)  Amount Transferred: 26ml  Comments:prior to latch due to mom's fullness mom had to pre pump . Infant fed 15-20 mins and was able to stay latched in a consistent swallowing pattern with intermittent gulps . After feeding infant sleepy . Had mom pump other breast yielding 30 ml which mom fed to infant because dad had time restrains to get to work .   Additional Feeding Assessment: Pre-feed Weight: Post-feed Weight: Amount Transferred: Comments:  Additional Feeding Assessment: Pre-feed Weight: Post-feed Weight: Amount Transferred: Comments:  Total Breast milk Transferred this Visit: 26ml at the breast  Total Supplement Given: 30ml EBM   Additional Interventions: Plan of care ! ) For mom naps ,drinking to thirst , nutritious meals and snacks  2) Transitioning back to  the breast ,also giving one bottle per day ( dad) 3) Feed Kristin Buck every 2-3 hours day and evening 15-20 mins 1st breast and offer 2nd breast 4) Empty 1st breast well to get to the fatty milk. 5) Extra pumping ( mom on B/P meds ? Until 6 week post partum check at 6 weeks . 2-3 x's per 24 hours .    Follow-Up: Next weigh check next Tuesday 11/16/2011 at [facility] Breast feeding support group . Then by 2/8 with Dr. Ala Dach ( mom needs to call for weight check appointment       Kristin Buck 11/10/2011, 6:43 PM

## 2013-12-05 ENCOUNTER — Other Ambulatory Visit: Payer: Self-pay | Admitting: Obstetrics and Gynecology

## 2013-12-05 DIAGNOSIS — N611 Abscess of the breast and nipple: Secondary | ICD-10-CM

## 2013-12-05 DIAGNOSIS — N644 Mastodynia: Secondary | ICD-10-CM

## 2013-12-11 ENCOUNTER — Ambulatory Visit
Admission: RE | Admit: 2013-12-11 | Discharge: 2013-12-11 | Disposition: A | Discharge status: Home / Self Care | Payer: BC Managed Care – PPO | Source: Ambulatory Visit | Attending: Obstetrics and Gynecology | Admitting: Obstetrics and Gynecology

## 2013-12-11 ENCOUNTER — Other Ambulatory Visit: Payer: Self-pay | Admitting: Obstetrics and Gynecology

## 2013-12-11 DIAGNOSIS — N644 Mastodynia: Secondary | ICD-10-CM

## 2013-12-11 DIAGNOSIS — N6320 Unspecified lump in the left breast, unspecified quadrant: Secondary | ICD-10-CM

## 2013-12-11 DIAGNOSIS — N611 Abscess of the breast and nipple: Secondary | ICD-10-CM

## 2013-12-18 ENCOUNTER — Other Ambulatory Visit: Payer: 59

## 2014-08-12 ENCOUNTER — Encounter (HOSPITAL_COMMUNITY): Payer: Self-pay | Admitting: Pediatrics

## 2014-11-08 IMAGING — MG MM DIGITAL DIAGNOSTIC BILAT CAD
6 series · 6 of 6 positions shown · non-contrast
Comparison: None.

CLINICAL DATA: Patient presents for evaluation of firm palpable
abnormality within the medial aspect of the left breast. She states
that she was recently started on antibiotics for suspected
mastitis/abscess and states the symptoms have slightly improved
during this period of time.

EXAM:
DIGITAL DIAGNOSTIC  BILATERAL MAMMOGRAM WITH CAD
ULTRASOUND LEFT BREAST

[L MLO]
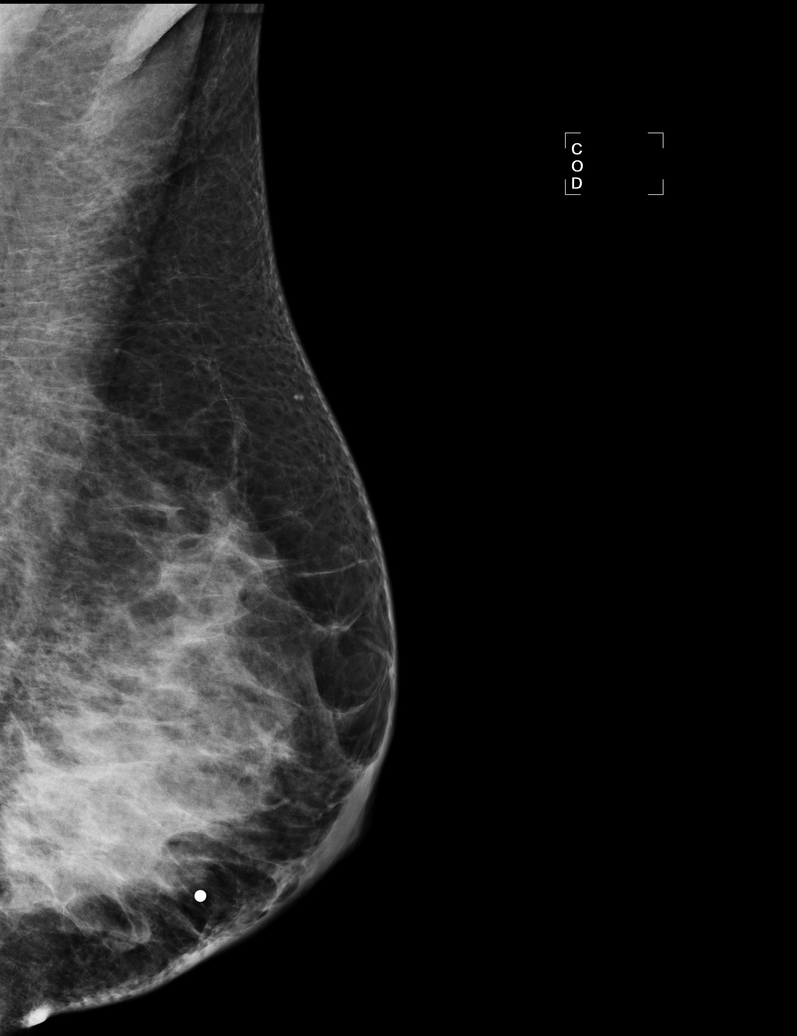

[R MLO]
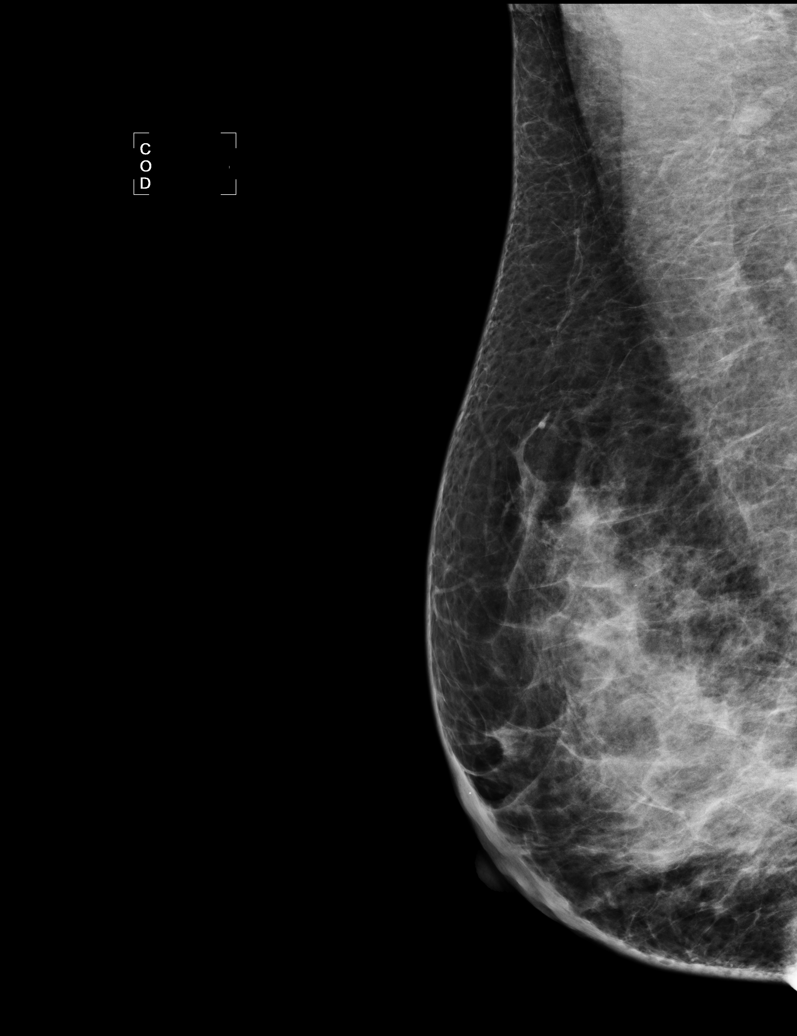

[L CC]
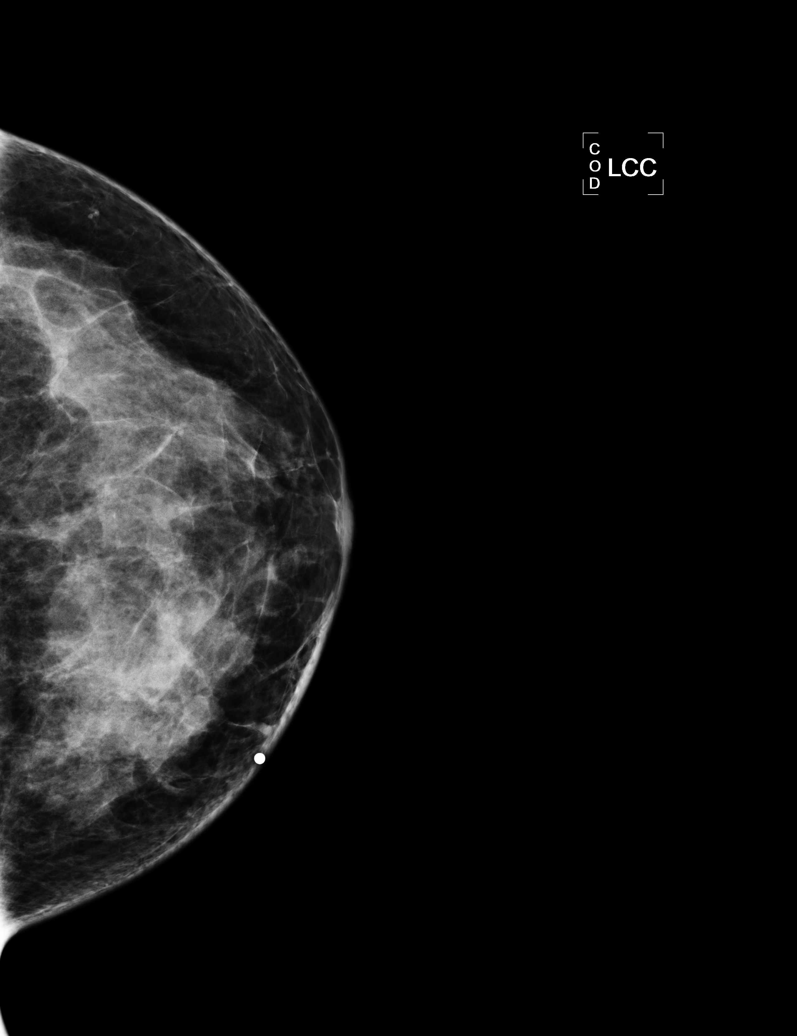

[R CC]
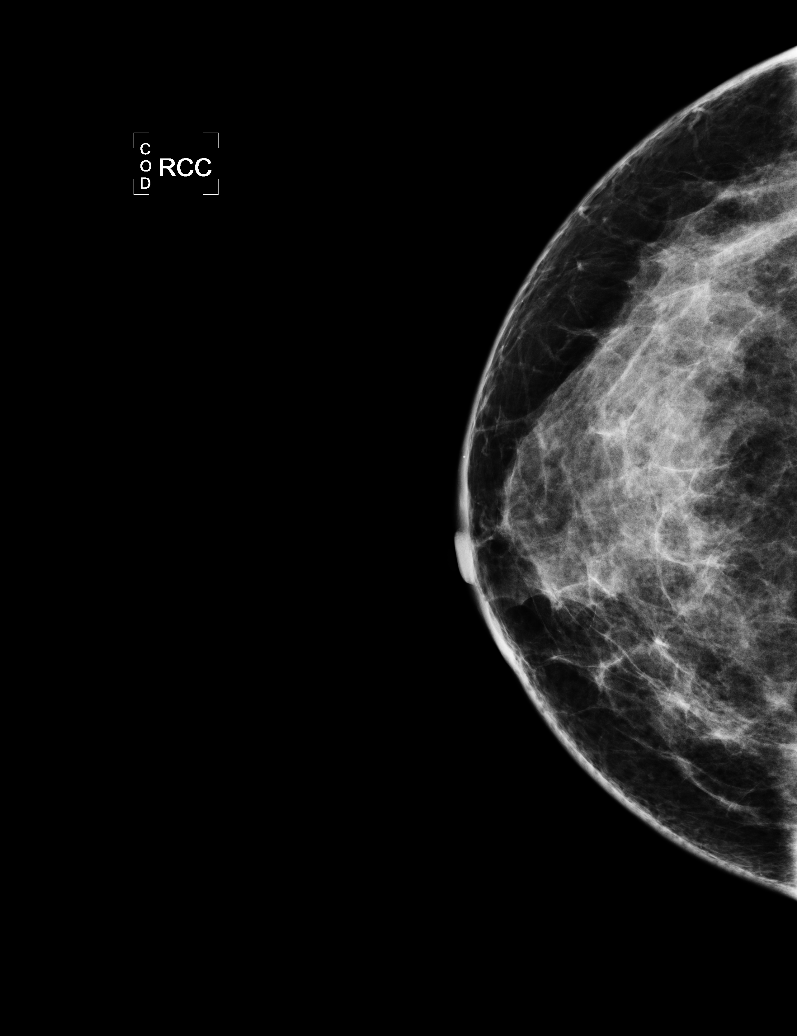

[L TAN (1 of 2)]
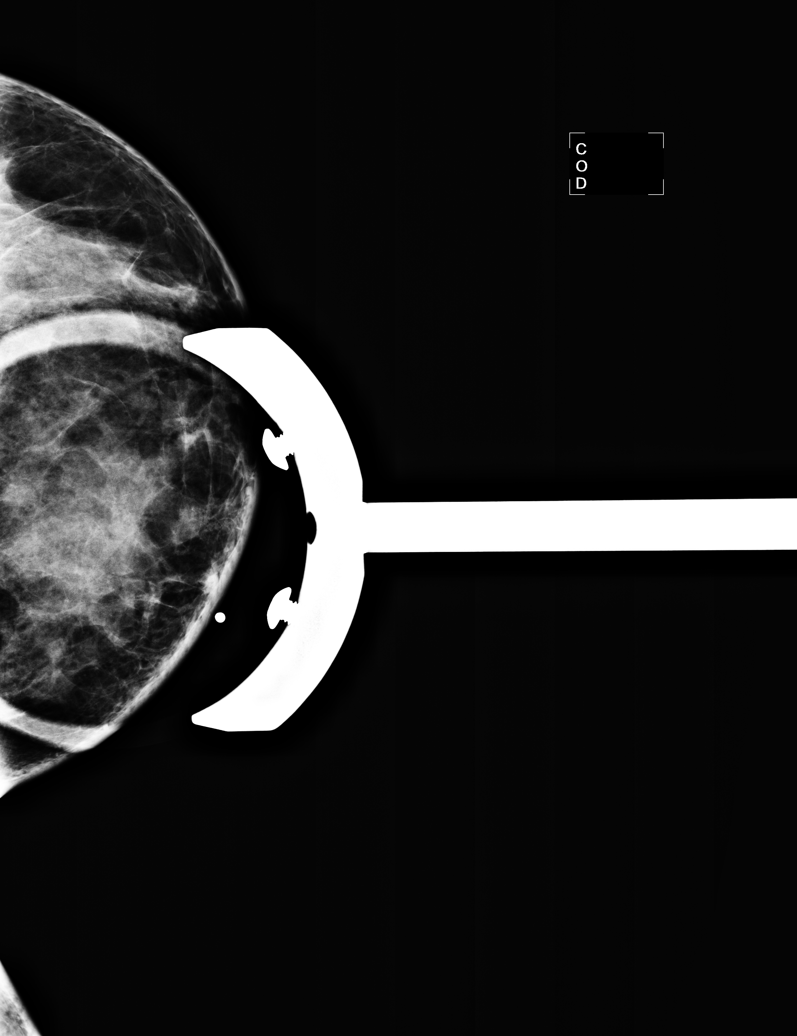

[L TAN (2 of 2)]
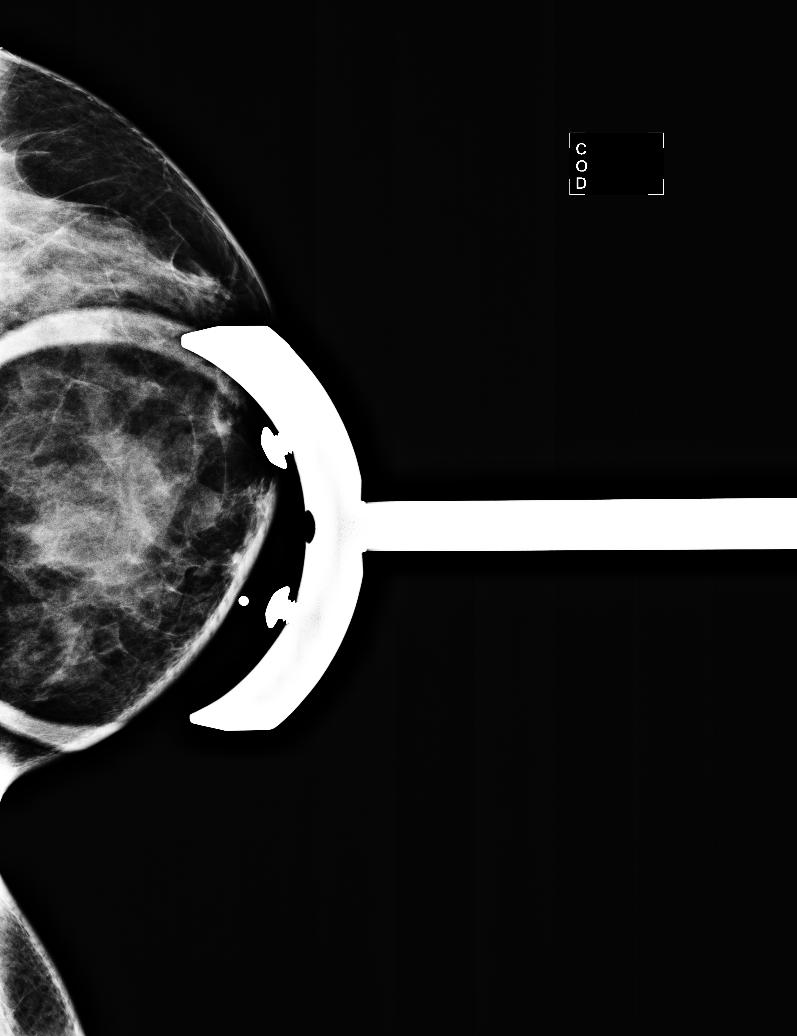

[6 of 6 positions shown; findings below may reference images not displayed]

ACR Breast Density Category c: The breast tissue is heterogeneously
dense, which may obscure small masses.
FINDINGS: Within the medial aspect of the left breast there is increased
parenchymal density at the site of palpable abnormality. No
additional concerning masses, calcifications or architectural
distortion identified.

Mammographic images were processed with CAD.

On physical exam, I palpate a firm mass within the medial aspect of
the left breast at the patients site of reported abnormality.

Ultrasound is performed, showing diffuse edema throughout the medial
aspect of the left breast with a small amount of mobile echogenic
material suggestive of abscess formation.
IMPRESSION: Increased parenchymal density with associated mixed echogenicity on
ultrasound most suggestive of edema and purulent fluid within the
left breast 7 o'clock position corresponding with palpable
abnormality. Findings are most suggestive of breast
abscess/mastitis.

RECOMMENDATION:
Continue antibiotic therapy until completion.

Diagnostic aspiration with gram stain and culture.

Followup in 2 weeks to evaluate for interval improvement/
resolution.

Findings and recommendations were discussed with Dr. Relindis on
December 11, 2013.

I have discussed the findings and recommendations with the patient.
Results were also provided in writing at the conclusion of the
visit. If applicable, a reminder letter will be sent to the patient
regarding the next appointment.

BI-RADS CATEGORY  3: Probably benign finding(s) - short interval
follow-up suggested.

## 2017-01-11 ENCOUNTER — Encounter: Payer: Self-pay | Admitting: *Deleted

## 2017-01-11 ENCOUNTER — Emergency Department (INDEPENDENT_AMBULATORY_CARE_PROVIDER_SITE_OTHER)
Admission: EM | Admit: 2017-01-11 | Discharge: 2017-01-11 | Disposition: A | Discharge status: Home / Self Care | Payer: 59 | Source: Home / Self Care | Attending: Family Medicine | Admitting: Family Medicine

## 2017-01-11 DIAGNOSIS — R109 Unspecified abdominal pain: Secondary | ICD-10-CM

## 2017-01-11 DIAGNOSIS — M546 Pain in thoracic spine: Secondary | ICD-10-CM

## 2017-01-11 DIAGNOSIS — T148XXA Other injury of unspecified body region, initial encounter: Secondary | ICD-10-CM

## 2017-01-11 HISTORY — DX: Essential (primary) hypertension: I10

## 2017-01-11 HISTORY — DX: Personal history of urinary calculi: Z87.442

## 2017-01-11 HISTORY — DX: Fatty (change of) liver, not elsewhere classified: K76.0

## 2017-01-11 LAB — POCT URINALYSIS DIP (MANUAL ENTRY)
Bilirubin, UA: NEGATIVE
Glucose, UA: NEGATIVE
Ketones, POC UA: NEGATIVE
Leukocytes, UA: NEGATIVE
Nitrite, UA: NEGATIVE
Protein Ur, POC: NEGATIVE
Spec Grav, UA: 1.02 (ref 1.030–1.035)
Urobilinogen, UA: 0.2 (ref ?–2.0)
pH, UA: 7 (ref 5.0–8.0)

## 2017-01-11 NOTE — Discharge Instructions (Signed)
Take 400-600mg  Ibuprofen (Motrin) every 6-8 hours for  pain  Alternate with Tylenol  Take  Tylenol every 4-6 hours as needed for  pain  Follow-up with your primary care provider next week for recheck of symptoms if not improving.   You may also use over the counter lidcaine cream or icy-hot to help with muscle soreness

## 2017-01-11 NOTE — ED Provider Notes (Signed)
CSN: [id number]     Arrival date & time 01/11/17  1952 History   First MD Initiated Contact with Patient 01/11/17 2003     Chief Complaint  Patient presents with  . Back Pain   (Consider location/radiation/quality/duration/timing/severity/associated sxs/prior Treatment) HPI  Kristin Buck is a 36 y.o. female presenting to UC with c/o 2-3 weeks of Right side mid back pain that is worse with certain movements and she feels associated increased burping.  She has hx of acid reflux and has recently started to take her medication again when symptoms started but has not had much relief. Pain is aching and sore, 6/10, occasionally radiates to Right flank and right groin. Denies fever, chills, n/v/d. Denies known injury. Denies blood in urine or dysuria. She thinks she may have taken tylenol or ibuprofen once of pain and does not recall if it helped or not.    Past Medical History:  Diagnosis Date  . Fatty liver   . Hypertension   . No pertinent past medical history   . Personal history of kidney stones    Past Surgical History:  Procedure Laterality Date  . CESAREAN SECTION    . NO PAST SURGERIES     Family History  Problem Relation Age of Onset  . Hypertension Mother   . Diabetes Mother   . Anesthesia problems Neg Hx   . Malignant hyperthermia Neg Hx   . Hypotension Neg Hx   . Pseudochol deficiency Neg Hx    Social History  Substance Use Topics  . Smoking status: Never Smoker  . Smokeless tobacco: Never Used  . Alcohol use No   OB History    Gravida Para Term Preterm AB Living   SAB TAB Ectopic Multiple Live Births           1     Review of Systems  Constitutional: Negative for chills and fever.  Gastrointestinal: Negative for abdominal pain, diarrhea, nausea and vomiting.  Genitourinary: Positive for flank pain. Negative for dysuria, frequency, hematuria and pelvic pain.  Musculoskeletal: Positive for back pain and myalgias. Negative for arthralgias and  gait problem.  Skin: Negative for color change and rash.  Neurological: Negative for weakness and numbness.    Allergies  Patient has no known allergies.  Home Medications   Prior to Admission medications   Medication Sig Start Date End Date Taking? Authorizing Provider  hydrochlorothiazide (MICROZIDE) 12.5 MG capsule Take 12.5 mg by mouth daily.   Yes Historical Provider, MD  omeprazole (PRILOSEC) 40 MG capsule Take 40 mg by mouth daily.   Yes Historical Provider, MD   Meds Ordered and Administered this Visit  Medications - No data to display  BP (!) 146/96 (BP Location: Left Arm)   Pulse 88   Temp 98.2 F (36.8 C) (Oral)   Resp 16   Ht 5' 8.5" (1.74 m)   Wt 191 lb (86.6 kg)   LMP 12/18/2016   SpO2 99%   BMI 28.62 kg/m  No data found.   Physical Exam  Constitutional: She is oriented to person, place, and time. She appears well-developed and well-nourished. No distress.  HENT:  Head: Normocephalic and atraumatic.  Eyes: EOM are normal.  Neck: Normal range of motion. Neck supple.  Cardiovascular: Normal rate and regular rhythm.   Pulmonary/Chest: Effort normal and breath sounds normal. No stridor. No respiratory distress. She has no wheezes. She has no rales. She exhibits no tenderness.  Abdominal: Soft. She exhibits no distension. There is no tenderness.  Musculoskeletal: Normal range of motion. She exhibits tenderness. She exhibits no edema.  No midline spinal tenderness. Tenderness to Right mid back along paraspinal muscles.  Full ROM upper and lower extremities bilaterally with 5/5 strength bilaterally.   Lymphadenopathy:    She has no cervical adenopathy.  Neurological: She is alert and oriented to person, place, and time.  Skin: Skin is warm and dry. She is not diaphoretic.  Psychiatric: She has a normal mood and affect. Her behavior is normal.  Nursing note and vitals reviewed.   Urgent Care Course     Procedures (including critical care time)  Labs  Review Labs Reviewed  POCT URINALYSIS DIP (MANUAL ENTRY) - Abnormal; Notable for the following:       Result Value   Blood, UA trace-intact (*)    All other components within normal limits    Imaging Review No results found.    MDM   1. Flank pain   2. Acute right-sided thoracic back pain   3. Muscle strain    UA unremarkable Pain likely due to back muscle strain.  Encouraged alternating acetaminophen, ibuprofen, cool and warm compresses, and may use OTC topical icy-hot  f/u with PCP in 1 week if not improving.     Junius Finner, PA-C 01/12/17 1213

## 2017-01-11 NOTE — ED Triage Notes (Addendum)
Pt c/o RT side mid back x 2 wks with increased burping.The pain radiates to her RT flank area. Denies injury. No OTC meds.

## 2018-10-25 ENCOUNTER — Other Ambulatory Visit: Payer: Self-pay | Admitting: Family Medicine

## 2018-10-25 DIAGNOSIS — Z1231 Encounter for screening mammogram for malignant neoplasm of breast: Secondary | ICD-10-CM

## 2018-11-24 ENCOUNTER — Ambulatory Visit: Payer: 59

## 2020-05-11 ENCOUNTER — Ambulatory Visit (HOSPITAL_COMMUNITY): Payer: Self-pay

## 2020-05-11 ENCOUNTER — Ambulatory Visit: Payer: Self-pay

## 2023-05-24 ENCOUNTER — Encounter: Payer: Self-pay | Admitting: *Deleted

## 2023-05-24 ENCOUNTER — Ambulatory Visit
Admission: EM | Admit: 2023-05-24 | Discharge: 2023-05-24 | Disposition: A | Discharge status: Home / Self Care | Payer: 59 | Attending: Family Medicine | Admitting: Family Medicine

## 2023-05-24 ENCOUNTER — Other Ambulatory Visit: Payer: Self-pay

## 2023-05-24 DIAGNOSIS — J069 Acute upper respiratory infection, unspecified: Secondary | ICD-10-CM | POA: Diagnosis not present

## 2023-05-24 DIAGNOSIS — R6889 Other general symptoms and signs: Secondary | ICD-10-CM | POA: Diagnosis not present

## 2023-05-24 LAB — POC SARS CORONAVIRUS 2 AG -  ED: SARS Coronavirus 2 Ag: NEGATIVE

## 2023-05-24 NOTE — ED Triage Notes (Signed)
C/O productive cough with rhinorrhea and nasal congestion onset 4 days ago with fevers up to 100.7 through the weekend. Pt states this morning started with significant weakness -- states has been having poor appetite, so she ate and drank some juice with slight improvement.Reports family members also having been ill recently and currently.

## 2023-05-24 NOTE — Discharge Instructions (Signed)
Rest, fluids, OTC medications \ Call  for problems

## 2023-05-24 NOTE — ED Provider Notes (Signed)
[facility]    CSN: [id number] Arrival date & time: 05/24/23  1241      History   Chief Complaint Chief Complaint  Patient presents with   Cough   Weakness    HPI Kristin Buck is a 42 y.o. female.   HPI Patient has had cough and cold symptoms with low-grade fever to 100.7.  This has been going on for 2 to 3 days.  Today she woke up with profound weakness.  Feels very tired and achy.  She feels like she is worse than she was yesterday.  She works from home.  She is a Engineer, civil (consulting).  Uncertain exposure to COVID.  Family members are also sick.  Past Medical History:  Diagnosis Date   Fatty liver    Hypertension    Personal history of kidney stones     There are no problems to display for this patient.   Past Surgical History:  Procedure Laterality Date   CESAREAN SECTION      OB History     Gravida  1   Para  1   Term  1   Preterm      AB      Living  1      SAB      IAB      Ectopic      Multiple      Live Births  1            Home Medications    Prior to Admission medications   Medication Sig Start Date End Date Taking? Authorizing Provider  hydrochlorothiazide (MICROZIDE) 12.5 MG capsule Take 12.5 mg by mouth daily.   Yes [provider]  metoprolol tartrate (LOPRESSOR) 25 MG tablet Take 25 mg by mouth 2 (two) times daily.   Yes [provider]  Multiple Vitamin (MULTIVITAMIN PO) Take by mouth.   Yes [provider]  VITAMIN D PO Take by mouth.   Yes [provider]  ZEPBOUND 7.5 MG/0.5ML Pen Inject into the skin. 05/17/23  Yes [provider]  omeprazole (PRILOSEC) 40 MG capsule Take 40 mg by mouth daily.    [provider]    Family History Family History  Problem Relation Age of Onset   Hypertension Mother    Diabetes Mother    Anesthesia problems Neg Hx    Malignant hyperthermia Neg Hx    Hypotension Neg Hx    Pseudochol deficiency Neg Hx     Social  History Social History   Tobacco Use   Smoking status: Never   Smokeless tobacco: Never  Vaping Use   Vaping status: Never Used  Substance Use Topics   Alcohol use: No   Drug use: Never     Allergies   Patient has no known allergies.   Review of Systems Review of Systems See HPI  Physical Exam Triage Vital Signs ED Triage Vitals  Encounter Vitals Group     BP 05/24/23 1252 (!) 143/100     Systolic BP Percentile --      Diastolic BP Percentile --      Pulse Rate 05/24/23 1252 96     Resp 05/24/23 1252 18     Temp 05/24/23 1252 98.5 F (36.9 C)     Temp Source 05/24/23 1252 Oral     SpO2 05/24/23 1252 97 %     Weight --      Height --      Head Circumference --  Peak Flow --      Pain Score 05/24/23 1253 0     Pain Loc --      Pain Education --      Exclude from Growth Chart --    No data found.  Updated Vital Signs BP (!) 138/97   Pulse 88   Temp 98.5 F (36.9 C) (Oral)   Resp 18   LMP 05/20/2023 (Exact Date)   SpO2 99%   Breastfeeding No      Physical Exam Constitutional:      General: She is not in acute distress.    Appearance: She is well-developed. She is ill-appearing.  HENT:     Head: Normocephalic and atraumatic.     Right Ear: Tympanic membrane and ear canal normal.     Left Ear: Tympanic membrane normal.     Nose: Congestion and rhinorrhea present.     Mouth/Throat:     Pharynx: Posterior oropharyngeal erythema present.  Eyes:     Conjunctiva/sclera: Conjunctivae normal.     Pupils: Pupils are equal, round, and reactive to light.  Cardiovascular:     Rate and Rhythm: Normal rate and regular rhythm.     Heart sounds: Normal heart sounds.  Pulmonary:     Effort: Pulmonary effort is normal. No respiratory distress.     Breath sounds: Normal breath sounds.  Abdominal:     General: There is no distension.     Palpations: Abdomen is soft.  Musculoskeletal:        General: Normal range of motion.     Cervical back: Normal range  of motion.  Lymphadenopathy:     Cervical: No cervical adenopathy.  Skin:    General: Skin is warm and dry.  Neurological:     Mental Status: She is alert.      UC Treatments / Results  Labs (all labs ordered are listed, but only abnormal results are displayed) Labs Reviewed  POC SARS CORONAVIRUS 2 AG -  ED    EKG   Radiology No results found.  Procedures Procedures (including critical care time)  Medications Ordered in UC Medications - No data to display  Initial Impression / Assessment and Plan / UC Course  I have reviewed the triage vital signs and the nursing notes.  Pertinent labs & imaging results that were available during my care of the patient were reviewed by me and considered in my medical decision making (see chart for details).     Reviewed that this is a flulike illness.  COVID-negative.  Treat symptoms Final Clinical Impressions(s) / UC Diagnoses   Final diagnoses:  Flu-like symptoms  Viral upper respiratory tract infection     Discharge Instructions      Rest, fluids, OTC medications \ Call  for problems     ED Prescriptions   None    PDMP not reviewed this encounter.   Eustace Moore, MD 05/24/23 1356
